# Patient Record
Sex: Female | Born: 1980 | Race: White | Hispanic: No | Marital: Married | Smoking: Never smoker
Health system: Southern US, Community
[De-identification: ages and names within clinical notes are randomized; demographics above are authoritative.]

## PROBLEM LIST (undated history)

## (undated) DIAGNOSIS — F419 Anxiety disorder, unspecified: Secondary | ICD-10-CM

## (undated) DIAGNOSIS — D649 Anemia, unspecified: Secondary | ICD-10-CM

## (undated) HISTORY — DX: Anxiety disorder, unspecified: F41.9

## (undated) HISTORY — DX: Anemia, unspecified: D64.9

## (undated) HISTORY — PX: OTHER SURGICAL HISTORY: SHX169

---
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
## 2002-10-17 ENCOUNTER — Inpatient Hospital Stay (HOSPITAL_COMMUNITY): Admission: AD | Admit: 2002-10-17 | Discharge: 2002-10-17 | Payer: Self-pay | Admitting: *Deleted

## 2004-11-14 ENCOUNTER — Other Ambulatory Visit: Admission: RE | Admit: 2004-11-14 | Discharge: 2004-11-14 | Payer: Self-pay | Admitting: Obstetrics and Gynecology

## 2005-03-29 ENCOUNTER — Ambulatory Visit (HOSPITAL_COMMUNITY): Admission: RE | Admit: 2005-03-29 | Discharge: 2005-03-29 | Payer: Self-pay | Admitting: Obstetrics and Gynecology

## 2005-05-29 ENCOUNTER — Inpatient Hospital Stay (HOSPITAL_COMMUNITY): Admission: RE | Admit: 2005-05-29 | Discharge: 2005-06-01 | Payer: Self-pay | Admitting: Obstetrics and Gynecology

## 2005-05-29 ENCOUNTER — Encounter (INDEPENDENT_AMBULATORY_CARE_PROVIDER_SITE_OTHER): Payer: Self-pay | Admitting: Specialist

## 2006-01-17 ENCOUNTER — Other Ambulatory Visit: Admission: RE | Admit: 2006-01-17 | Discharge: 2006-01-17 | Payer: Self-pay | Admitting: Obstetrics and Gynecology

## 2006-09-29 IMAGING — US US OB COMP +14 WK
1 series · 13 of 28 positions shown · non-contrast
Comparison: none

CLINICAL DATA: History of intrauterine growth retardation.  Assess growth.

[Series 1: us ob comp +14 wk · 13 of 31 slices shown]
[im 2/31]
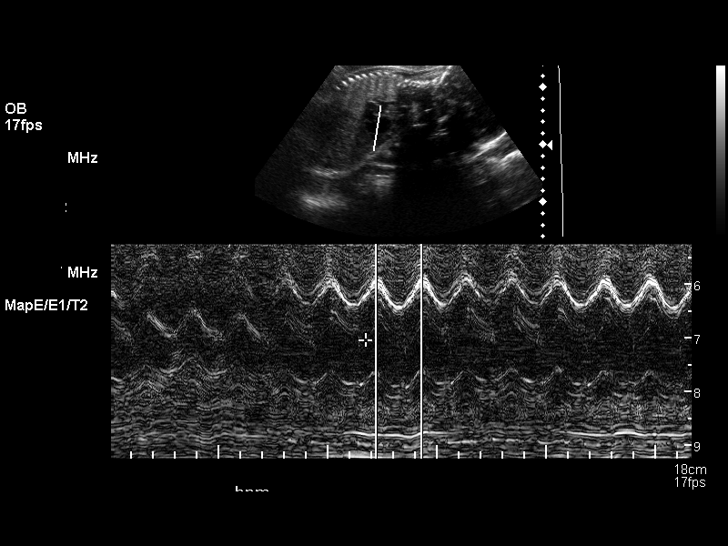
[im 4/31]
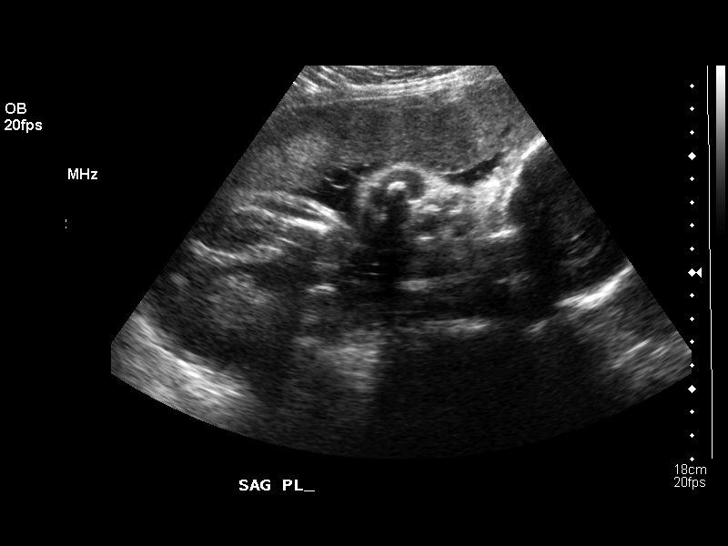
[im 6/31]
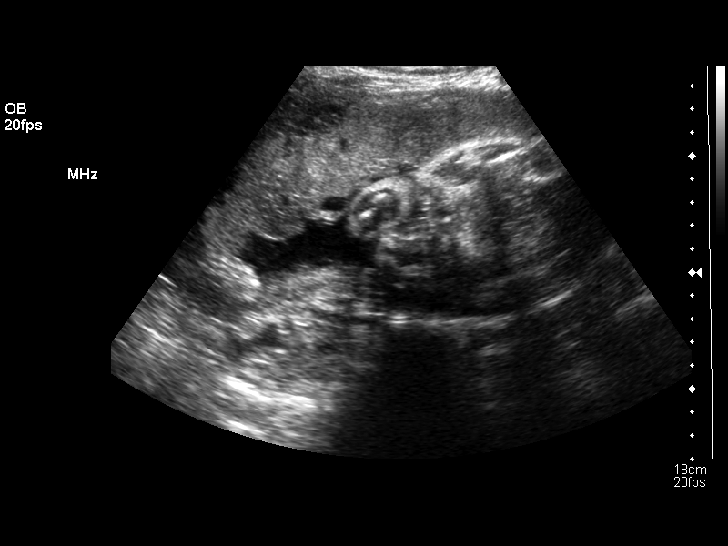
[im 8/31]
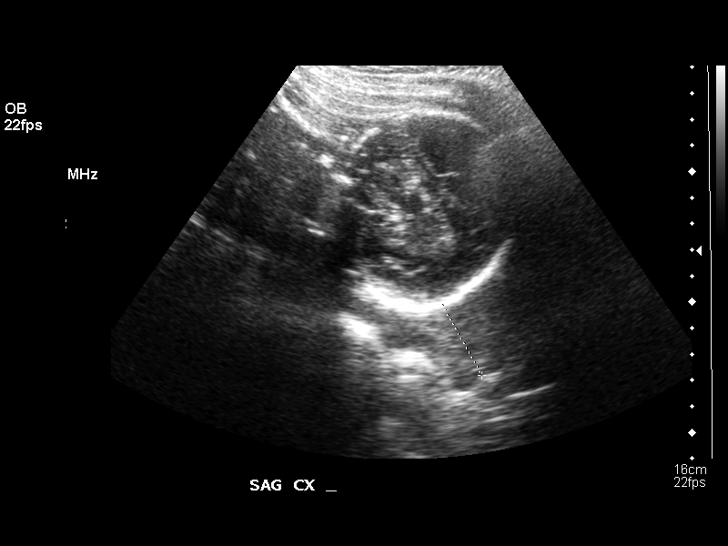
[im 11/31]
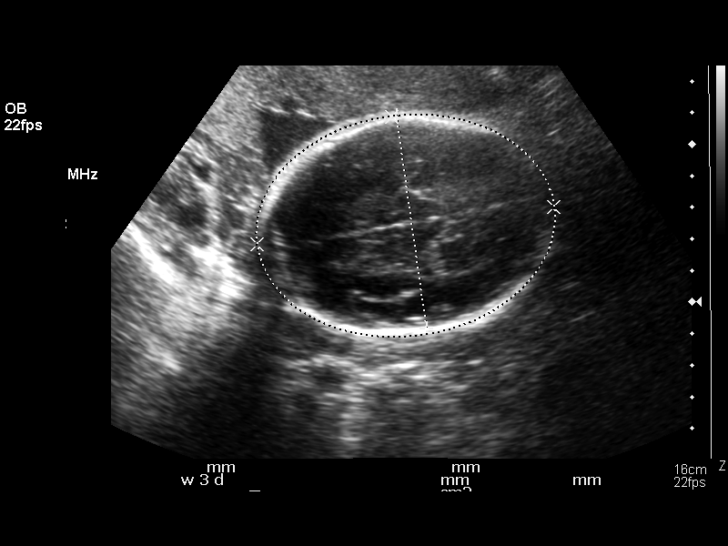
[im 13/31]
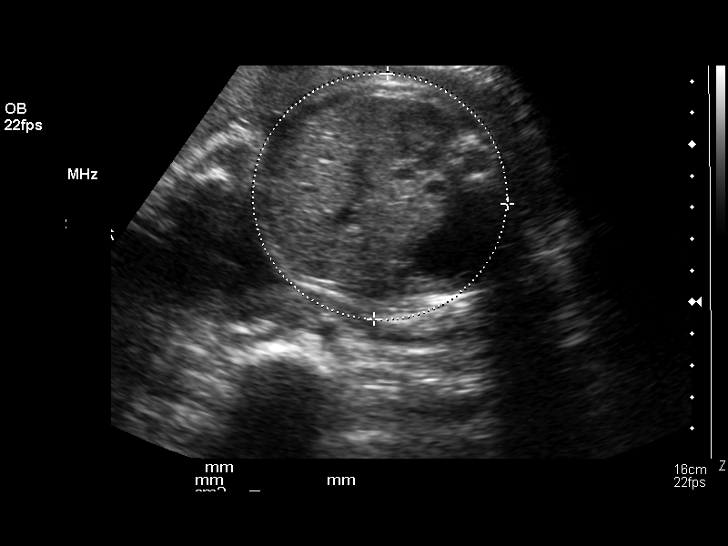
[im 16/31]
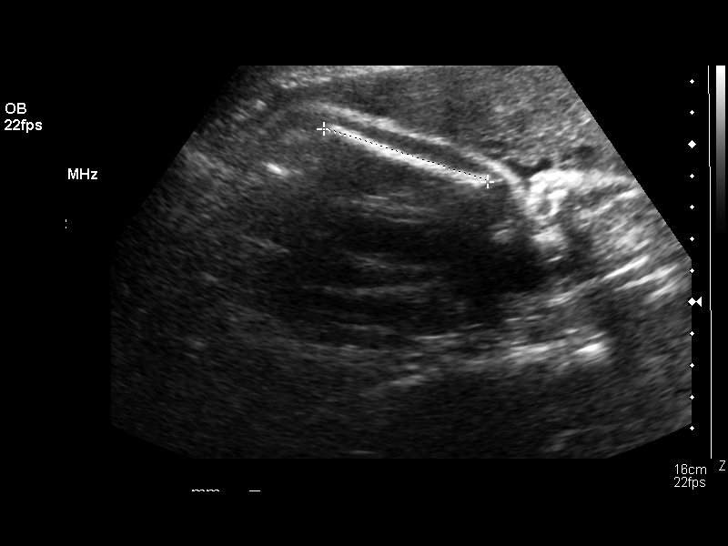
[im 18/31]
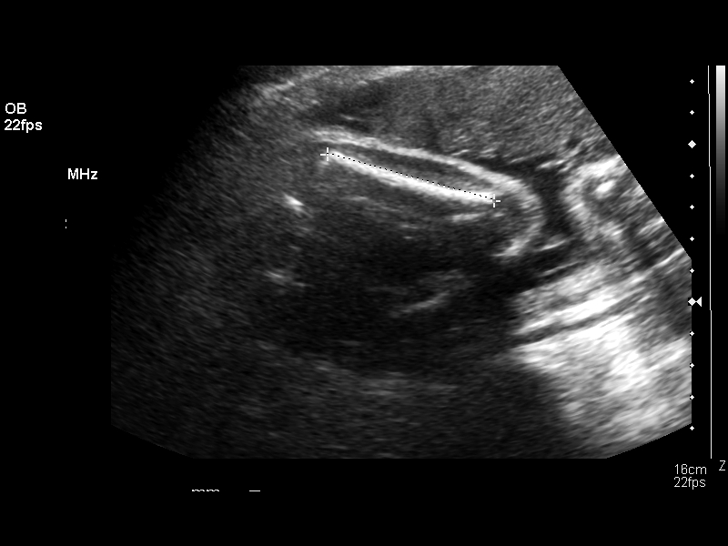
[im 21/31]
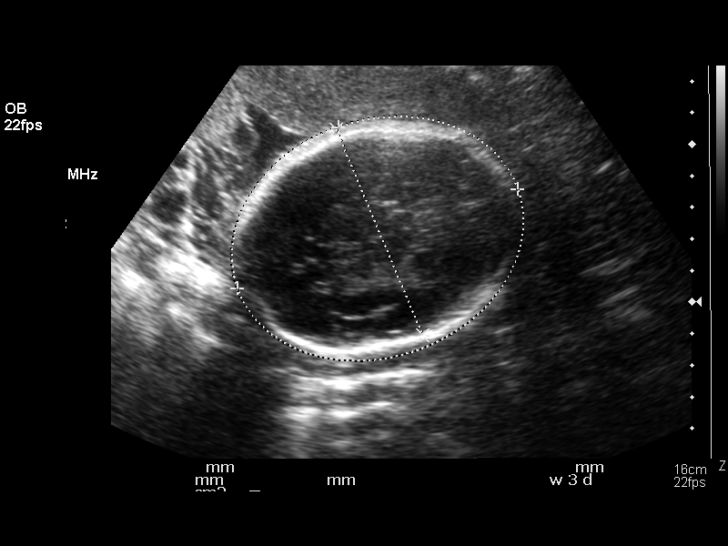
[im 23/31]
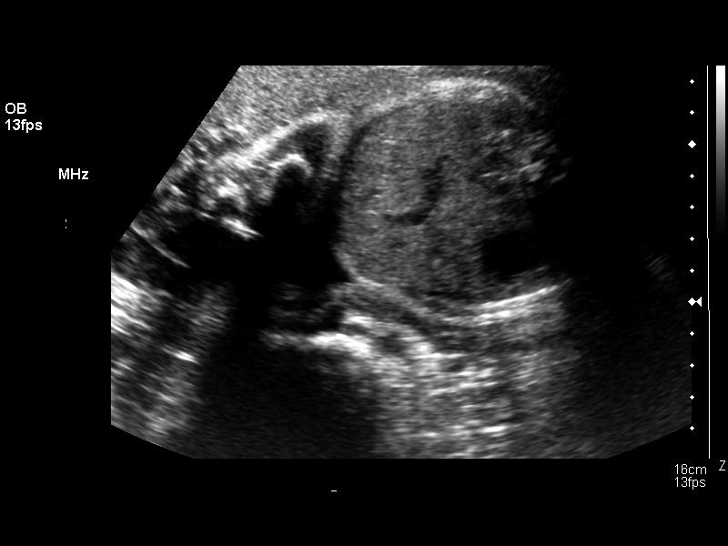
[im 25/31]
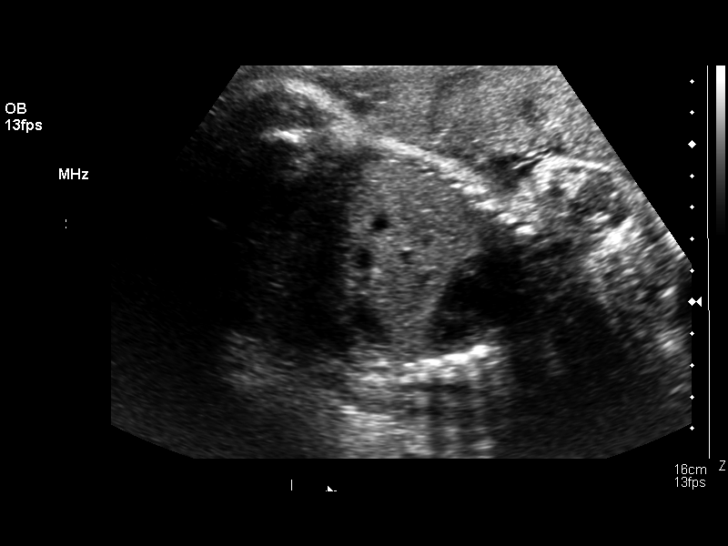
[im 27/31]
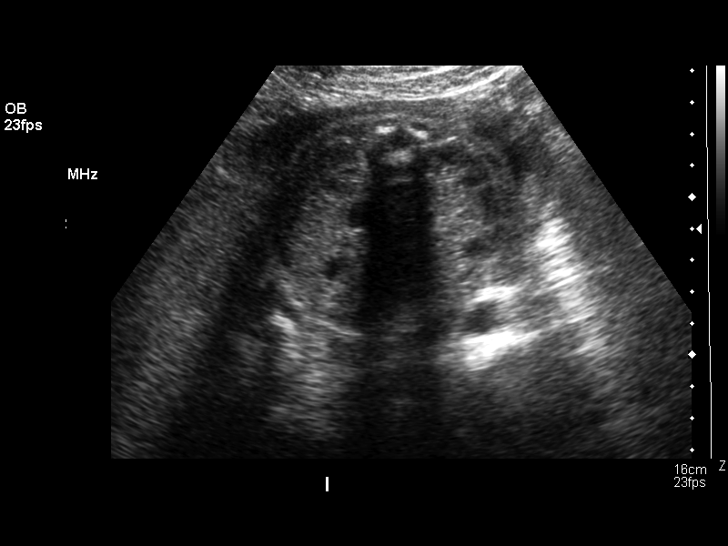
[im 29/31]
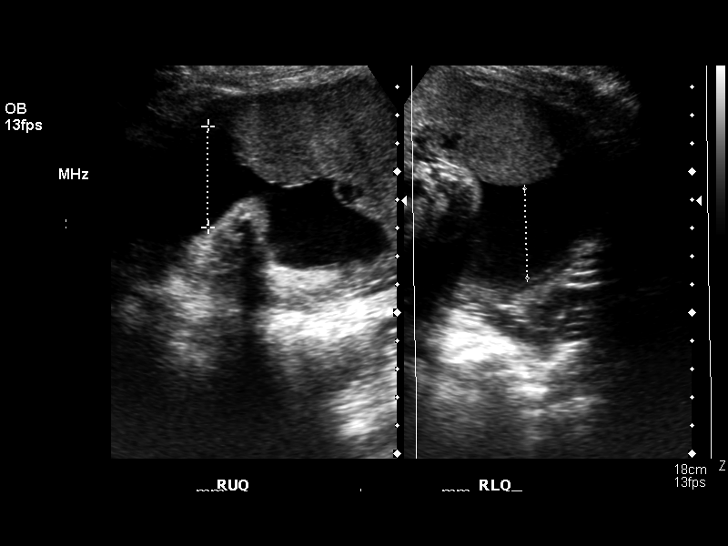

[13 of 28 positions shown; findings below may reference images not displayed]

OBSTETRICAL ULTRASOUND:
Number of Fetuses:  1
Heart Rate:  144
Movement:  Yes
Breathing:    No  
Presentation:  Cephalic
Placental Location:  Anterior
Grade:  I
Previa:  No
Amniotic Fluid (Subjective):  Normal
Amniotic Fluid (Objective):   13.6 cm AFI (5th -95th%ile = 9.0 ? 23.4 cm for 30 wks)

FETAL BIOMETRY
BPD:  6.9 cm   28 w 0 d
HC:  26.4 cm   28 w 5 d
AC:  25.1 cm  29 w 2 d
FL:    5.5 cm   29 w 0 d

MEAN GA:  28 w 5 d
Fetal indices are within normal limits.  
EFW: 0886 g (H) 50th ? 75th%ile (8085 ? 9399 g) For 30 wks

FETAL ANATOMY
Lateral Ventricles:    Not visualized 
Thalami/CSP:      Visualized 
Posterior Fossa:  Not visualized 
Nuchal Region:    N/A
Spine:      Not visualized 
4 Chamber Heart on Left:      Not visualized 
Stomach on Left:      Visualized 
3 Vessel Cord:    Not visualized 
Cord Insertion site:    Not visualized 
Kidneys:  Visualized 
Bladder:  Visualized 
Extremities:      Not visualized 

ADDITIONAL ANATOMY VISUALIZED:  Diaphragm and female genitalia.

Evaluation limited by:  Advanced gestational age 

MATERNAL UTERINE AND ADNEXAL FINDINGS
Cervix: 3.1 cm transabdominally
IMPRESSION: 1.  Single intrauterine pregnancy demonstrating an estimated gestational age by ultrasound of 28 weeks and 5 days.  This is 1 week and 3 days behind assigned gestational age of 30 weeks and 1 day.  The estimated fetal weight is just above the 50th percentile for a 30 week gestation however and suggests appropriate growth.  Continued close follow-up for growth for growth would be recommended given the patient?s history of early IUGR with early development.
2.  Subjectively and quantitatively normal amniotic fluid volume and normal cervical length.
3.  No late developing fetal anatomic abnormalities are identified associated with the stomach, kidneys or bladder.  The lateral ventricles and four chamber heart view could not be seen with clarity due to positioning on today?s exam.

## 2009-03-28 ENCOUNTER — Ambulatory Visit (HOSPITAL_COMMUNITY): Admission: RE | Admit: 2009-03-28 | Discharge: 2009-03-28 | Payer: Self-pay | Admitting: Obstetrics and Gynecology

## 2009-03-28 ENCOUNTER — Encounter (INDEPENDENT_AMBULATORY_CARE_PROVIDER_SITE_OTHER): Payer: Self-pay | Admitting: Obstetrics and Gynecology

## 2010-05-08 ENCOUNTER — Emergency Department (HOSPITAL_COMMUNITY): Admission: EM | Admit: 2010-05-08 | Discharge: 2010-05-08 | Payer: Self-pay | Admitting: Emergency Medicine

## 2010-08-18 ENCOUNTER — Emergency Department (HOSPITAL_COMMUNITY)
Admission: EM | Admit: 2010-08-18 | Discharge: 2010-08-18 | Payer: Self-pay | Source: Home / Self Care | Admitting: Family Medicine

## 2010-11-28 LAB — GC/CHLAMYDIA PROBE AMP, GENITAL
Chlamydia, DNA Probe: NEGATIVE
GC Probe Amp, Genital: NEGATIVE

## 2010-11-28 LAB — POCT URINALYSIS DIPSTICK
Glucose, UA: NEGATIVE mg/dL
Ketones, ur: NEGATIVE mg/dL
Urobilinogen, UA: 1 mg/dL (ref 0.0–1.0)

## 2010-11-28 LAB — WET PREP, GENITAL
Trich, Wet Prep: NONE SEEN
WBC, Wet Prep HPF POC: NONE SEEN

## 2010-12-01 LAB — WET PREP, GENITAL
Trich, Wet Prep: NONE SEEN
Yeast Wet Prep HPF POC: NONE SEEN

## 2010-12-01 LAB — GC/CHLAMYDIA PROBE AMP, GENITAL: Chlamydia, DNA Probe: NEGATIVE

## 2010-12-24 LAB — CBC
Hemoglobin: 8.3 g/dL — ABNORMAL LOW (ref 12.0–15.0)
MCHC: 30.9 g/dL (ref 30.0–36.0)
RDW: 18.3 % — ABNORMAL HIGH (ref 11.5–15.5)
WBC: 4.4 10*3/uL (ref 4.0–10.5)

## 2011-01-30 NOTE — Op Note (Signed)
NAME:  Tara Guzman, Tara Guzman                 ACCOUNT NO.:  [id number]   MEDICAL RECORD NO.:  [id number]          PATIENT TYPE:  AMB   LOCATION:  SDC                           [facility]:  [facility]   PHYSICIAN:  Dois Davenport A. Rivard, M.D. DATE OF BIRTH:  April 24, 1981   DATE OF PROCEDURE:  03/28/2009  DATE OF DISCHARGE:  03/28/2009                               OPERATIVE REPORT   PREOPERATIVE DIAGNOSES:  Metrorrhagia, endometrial polyps, anemia.   POSTOPERATIVE DIAGNOSES:  Metrorrhagia, endometrial polyps, anemia.   ANESTHESIA:  General, Dr. Arby Barrette.   PROCEDURE:  Hysteroscopy, resection of endometrial polyps, dilatation  and curettage.   SURGEON:  Crist Fat. Rivard, MD   ASSISTANT:  No assistant.   ESTIMATED BLOOD LOSS:  Minimal.   PROCEDURE:  After being informed of the planned procedure with possible  complications including bleeding, infection, injury to uterus, informed  consent is obtained.  The patient is taken to OR #3 given general  anesthesia with laryngeal mask ventilation without any complication.  She is placed in lithotomy position, prepped, and draped in a sterile  fashion and her bladder is emptied with an in-and-out red rubber  catheter.  Pelvic exam reveals a retroverted uterus normal in size and  shape, 2 normal adnexa.  A weighted speculum is inserted.  Anterior lip  of the cervix was grasped with a tenaculum forceps and we proceed with a  paracervical block using Novocaine 1% 20 mL in the usual fashion.  The  uterus was sounded at 8.5 cm and the cervix was easily dilated using  Hegar dilator until #29 which allows easy entry of an operative  hysteroscope.  With perfusion of glycine 1.5% at a maximum pressure of  80 mmHg, we are able to visualize the entire uterine cavity.   OBSERVATION:  Both tubal ostia are normal.  The fundus was normal.  Anterior wall endometrium is slightly thicker than lateral walls and  posterior wall endometrium is definitely much, much thicker than the  rest of the uterine cavity.  In the lower uterine segment anteriorly and  on the right side, we see 2 endometrial polyps measuring 0.5 cm each  which are easily resected and sent separately.  Hysteroscope was removed  and the uterine cavity is curetted with sharp curette which removes a  large amount of normal-appearing endometrium.  Hysteroscope was  reinserted to visualize an empty cavity with no active bleeding.  Instruments were then removed.  Bleeding on the cervical anterior lip of  the cervix were tenaculum and Jacobs forceps was placed is controlled  with a figure-of-eight stitch of 2-0 Vicryl.   Instrument and sponge count is complete x2.  Estimated blood loss is  minimal.  Fluid deficit is less than 100 and procedure is well tolerated  by the patient, who is taken to recovery room in the well and stable  condition.   SPECIMEN:  Endometrial polyps and endometrial curettings sent to  pathology.      Crist Fat Rivard, M.D.  Electronically Signed     SAR/MEDQ  D:  03/28/2009  T:  03/29/2009  Job:  [id number] 

## 2011-02-02 NOTE — Discharge Summary (Signed)
NAME:  Tara Guzman, Tara Guzman                 ACCOUNT NO.:  [id number]   MEDICAL RECORD NO.:  [id number]          PATIENT TYPE:  INP   LOCATION:  9145                          [facility]ACILITY:  [facility]   PHYSICIAN:  Crist Fat. Rivard, M.D. DATE OF BIRTH:  [birth year]   DATE OF ADMISSION:  05/29/2005  DATE OF DISCHARGE:  06/01/2005                                 DISCHARGE SUMMARY   ADMISSION DIAGNOSES:  1.  Intrauterine pregnancy at 39 weeks.  2.  Two previous cesarean sections.  3.  Desires repeat cesarean section and sterilization.   DISCHARGE DIAGNOSES:  1.  Intrauterine pregnancy at 39 weeks.  2.  Two previous cesarean sections.  3.  Desires repeat cesarean section and sterilization.  4.  Status post repeat left transverse cesarean section of a viable female      infant named Savannah weighing 6 pounds 7 ounces,  Apgars 8 and 9.  5.  Status post bilateral tubal ligation.   OPERATION/PROCEDURE:  1.  Spinal anesthesia.  2.  Repeat low transverse cesarean section.  3.  Bilateral tubal ligation, elective.   HOSPITAL COURSE:  The patient was admitted for an elective cesarean section  and tubal ligation which was performed under spinal anesthesia by Dr. Estanislado Pandy  with no complications.  Estimated blood loss was 500 mL.  She was taken to  recovery where routine care was given.  She was bottle feeding her baby and  got out of bed on the first day and had no syncope despite a hemoglobin of  8.4  She declined blood transfusions.  Postoperative day #2 she continued to  improve with ambulation and increasing her diet.  Incision remained intact.  On postoperative day #3 she was ready to go home.  Vital signs were stable,  afebrile.  Heart regular rate and rhythm. Chest was clear.  Abdomen soft and  appropriately tender.  Incision was clean, dry and intact. Lochia was  normal.  Extremities were normal.  She was deemed to have received full  benefit of her hospital stay and was discharged home.   DISCHARGE  MEDICATIONS:  1.  Motrin 600 mg p.o. q.6h. p.r.n.  2.  Tylox one to two p.o. q.4h. p.r.n.   LABORATORY DATA:  White blood cell count 11.2, hemoglobin 8.4, platelet  count 157, RPR nonreactive.   DISCHARGE INSTRUCTIONS:  Per [facility] handout.   FOLLOW UP:  In six weeks or p.r.n.      Marie L. Williams, C.N.M.      Crist Fat Rivard, M.D.  Electronically Signed    MLW/MEDQ  D:  06/01/2005  T:  06/01/2005  Job:  [id number]

## 2011-02-02 NOTE — H&P (Signed)
NAME:  Tara Guzman, Tara Guzman                 ACCOUNT NO.:  [id number]   MEDICAL RECORD NO.:  [id number]          PATIENT TYPE:  INP   LOCATION:  NA                            [facility]   PHYSICIAN:  Dois Davenport A. Rivard, M.D. DATE OF BIRTH:  [birth year]   DATE OF ADMISSION:  05/29/2005  DATE OF DISCHARGE:                                HISTORY & PHYSICAL   HISTORY OF PRESENT ILLNESS:  Tara Guzman is a 30 year old, gravida 4, para 1-1-  1-2, who presents today for repeat cesarean section and bilateral tubal  sterilization. Her pregnancy has been followed by the MD service at [facility] and  is remarkable for: Previous C-section, desires repeat, desires  sterilization; history of IUGR, ?LMP and a history of abnormal Pap smear.  This patient began prenatal care at [facility] on November 14, 2004. EDC  determined by ultrasound in the early first trimester and confirmed with  follow-up. Her pregnancy has been essentially uncomplicated. She has been  size equal to dates throughout, she has been normotensive with no  proteinuria. The patient's due date is June 06, 2005, and she presents  at 79 weeks' gestation, term, for a repeat cesarean delivery and bilateral  tubal sterilization.   PRENATAL LABORATORY WORK:  On November 27, 2004, hemoglobin and hematocrit 12.4  and 36.8, platelets 210,000. Blood type and Rh O+, antibody screen negative,  VDRL nonreactive, rubella immune, hepatitis B surface antigen negative, HIV  nonreactive. Pap smear normal. CF testing negative. Quad screen within  normal limits at 28 weeks. One-hour glucose challenge normal. RPR negative  and hemoglobin 11.2 at 36 weeks, culture of the vaginal tract is negative  for group B strep.   ALLERGIES:  The patient has no known drug allergies.   HABITS:  She denies the use of tobacco, alcohol or illicit drugs.   OBSTETRICAL HISTORY:  In 2001 the patient had a first trimester elective AB  with no complications, in 2002 the patient had a cesarean  delivery at 26  weeks, that baby's name was Tara Guzman and was in the NICU for 3 months, in 2004  the patient had a repeat cesarean delivery at 39 weeks with the birth of a 6  pounds 8 ounces female infant at term named Tara Guzman and the current pregnancy.   MEDICAL HISTORY:  Significant for an abnormal Pap smear. Pap smear at the  beginning of this pregnancy was normal. The patient's medical history is  unremarkable.   SURGICAL HISTORY:  C-section x2.   FAMILY HISTORY:  Maternal grandmother MI, deceased. Maternal grandfather  chronic hypertension. Maternal grandfather varicose veins. Maternal  grandmother with a history of diabetes, deceased. Maternal grandfather  strokes. Mother ovarian cancer.   GENETIC HISTORY:  The patient's daughter was born with a heart murmur, no  surgery required. Paternal uncle and paternal aunt with mental retardation.  The patient's father is a twin and the father of the baby's father is a  twin.   SOCIAL HISTORY:  Tara Guzman is a 30 year old married Caucasian female. She  works as a Education administrator and her husband Tara Pilgrim  Guzman works in  Office manager. They are [employer] in their faith.   REVIEW OF SYSTEMS:  As described above. The patient is typical of one with a  uterine pregnancy at term. She presents to [facility] of [place] on  May 29, 2005, for repeat cesarean delivery and bilateral tubal  sterilization.   PHYSICAL EXAMINATION:  VITAL SIGNS:  Stable, afebrile.  HEENT:  Unremarkable.  HEART:  Regular rate and rhythm.  LUNGS:  Clear.  ABDOMEN:  Gravid in its contour. Uterine fundus is noted to extend 40 cm  above the level of the pubic symphysis. Leopold's maneuvers finds the infant  to be in a longitudinal lie, cephalic presentation and the estimated fetal  weight is 7 pounds.  The fetal heart rate has been stable at 150s.  EXTREMITIES:  Show no pathologic edema. There is no calf tenderness. DTRs  are 1+ with no clonus  please.   ASSESSMENT:  Intrauterine pregnancy at 39 weeks, for repeat cesarean  delivery and bilateral tubal ligation.   PLAN:  Admit per Dr. Dois Davenport Rivard, routine preoperative orders in  preparation for surgery.      Tara Guzman, C.N.M.      Crist Fat Rivard, M.D.  Electronically Signed    SDM/MEDQ  D:  05/29/2005  T:  05/29/2005  Job:  [id number]

## 2011-02-02 NOTE — Op Note (Signed)
NAME:  Tara Guzman, Tara Guzman                 ACCOUNT NO.:  [id number]   MEDICAL RECORD NO.:  [id number]          PATIENT TYPE:  INP   LOCATION:  9145                          [facility]   PHYSICIAN:  Crist Fat. Rivard, M.D. DATE OF BIRTH:  07-31-1981   DATE OF PROCEDURE:  05/29/2005  DATE OF DISCHARGE:                                 OPERATIVE REPORT   PREOPERATIVE DIAGNOSES:  1.  Intrauterine pregnancy at 39 weeks.  2.  Two previous cesarean sections.  3.  Desire for repeat cesarean section and sterilization.   POSTOPERATIVE DIAGNOSES:  1.  Intrauterine pregnancy at 39 weeks.  2.  Two previous cesarean sections.  3.  Desire for repeat cesarean section and sterilization.   ANESTHESIA:  Spinal.   PROCEDURE:  Repeat low transverse cesarean section and bilateral tubal  ligation.   SURGEON:  Dr. Estanislado Pandy   ASSISTANT:  Mack Guise, certified nurse midwife.   ESTIMATED BLOOD LOSS:  500 mL.   PROCEDURE:  After being informed of the planned procedure with possible  complications including bleeding; infection; injury to bowels, bladder and  ureters as well as irreversibility of tubal ligation and failure rate of  1:500, informed consent was obtained.  The patient is taken to OR #1, given  spinal anesthesia without complication.  She is placed in the dorsal  decubitus position, pelvis tilted to the left, prepped and draped in a  sterile fashion, and a Foley catheter is inserted in her bladder.   After assessing adequate level of anesthesia, we proceed with local  infiltration of the incision with 20 mL of Marcaine 0.25 and perform a  Pfannenstiel incision overlooking the previous scar which is brought down to  the fascia sharply.  Fascia is incised in a low transverse fashion.  Linea  alba is dissected.  Peritoneum is entered in the midline fashion.  Visceral  peritoneum is entered in a low transverse fashion allowing [place] to safely  retract bladder by developing a bladder flap.   Myometrium which is very thin  is then incised in a low transverse fashion, first with knife, extended  bluntly.  Amniotic fluid is clear.  We assist the birth of a female infant  at 23 in ROA presentation.  Mouth and nose are suctioned.  Baby is  delivered. Cord is clamped with two Kelly clamps and suctioned and the baby  is given to the pediatrician present in the room.  Ancef 1 gram IV is given  to the patient; 20 mL of blood is drawn from the umbilical vein, and the  placenta is allowed to deliver spontaneously.  It is complete, and the cord  has 3 vessels.  Uterine revision is negative.   Myometrium is closed in two layers, first with a running locked suture of 0  Vicryl then with a Lembert suture of 0 Vicryl imbricating the first layer.  Hemostasis is checked and adequate.  At this time, we note adhesion between  the left ovary and the anterior wall of the uterus.  These adhesions are  sectioned with cautery.  The ovary is completely  freed.  Both paracolic  gutters are clean.  Both tubes and ovaries are then assessed and normal, and  the pelvis is profusely irrigated with warm saline.  Hemostasis is rechecked  and deemed adequate.   We then proceed with tubal ligation by isolating each tube using 2 Babcock  forceps until fimbriae is visualized.  The mesosalpinx is entered with  cautery.  Each tube is doubly ligated on the proximal stump and on the  distal stump using 0 chromic.  A section of 1.5 cm is sharply removed, and  both stumps of each tube is then cauterized.  Hemostasis is adequate.  We  rechecked hemostasis on the uterine incision which is adequate.  On the  fascia hemostasis is completed with cautery, and the fascia is closed with 2  running sutures of 1 Vicryl meeting midline.  Incision is then irrigated  with saline, and scar tissue is released on the right angle with cautery.  Skin is closed with subcuticular suture of 3-0 Monocryl and Steri-Strips.   Instruments  and sponge count is complete x 2.  Estimated blood loss is 500  mL.  The little girl named Tara Guzman, was born at 3:45 p.m., received  Apgars of 8 at one minute and 9 at five minutes and weighs 6 pounds 7  ounces.  Instruments and sponge count is complete x 2.  Estimated blood loss  is 500 mL.  The procedure is very well tolerated by the patient who is taken  to recovery room in a well and stable condition.      Crist Fat Rivard, M.D.  Electronically Signed     SAR/MEDQ  D:  05/29/2005  T:  05/29/2005  Job:  [id number]

## 2012-11-19 ENCOUNTER — Encounter (INDEPENDENT_AMBULATORY_CARE_PROVIDER_SITE_OTHER): Payer: 59 | Admitting: Physician Assistant
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
## 2012-11-26 ENCOUNTER — Ambulatory Visit: Payer: 59 | Admitting: Obstetrics and Gynecology

## 2012-11-26 ENCOUNTER — Encounter: Payer: Self-pay | Admitting: Obstetrics and Gynecology

## 2012-11-26 VITALS — BP 118/66 | Ht 60.0 in | Wt 157.0 lb

## 2012-11-26 DIAGNOSIS — Z9851 Tubal ligation status: Secondary | ICD-10-CM

## 2012-11-26 DIAGNOSIS — F529 Unspecified sexual dysfunction not due to a substance or known physiological condition: Secondary | ICD-10-CM

## 2012-11-26 DIAGNOSIS — Z9889 Other specified postprocedural states: Secondary | ICD-10-CM

## 2012-11-26 DIAGNOSIS — Z124 Encounter for screening for malignant neoplasm of cervix: Secondary | ICD-10-CM

## 2012-11-26 DIAGNOSIS — Z01419 Encounter for gynecological examination (general) (routine) without abnormal findings: Secondary | ICD-10-CM

## 2012-11-26 NOTE — Progress Notes (Signed)
The patient reports:low libido/ sexual desire  Contraception:uterine ablation   Last mammogram:n/a Last pap:per pt 2013  States normal  GC/Chlamydia cultures offered: declined HIV/RPR/HbsAg offered:  declined HSV 1 and 2 glycoprotein offered: declined  Menstrual cycle regular and monthly: No:  Had uterine ablation 2012 Menstrual flow normal: No:  Had surgery  Urinary symptoms: none Normal bowel movements: Yes Reports abuse at home: No:   Subjective:    Tara Guzman is a 32 y.o. female, No obstetric history on file., who presents for an annual exam.     History   Social History  . Marital Status: Married    Spouse Name: Gerilyn Pilgrim    Number of Children: 3  . 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 Smoking status: Never Smoker   . Smokeless tobacco: Never Used  . Alcohol Use: 0 - .5 oz/week    0-1 drink(s) per week     Comment: once every few months  . Drug Use: No  . Sexually Active: Yes -- Female partner(s)    Birth Control/ Protection: Surgical   Other Topics Concern  . None   Social History Narrative   Lives with her husband and their 3 daughters.    Menstrual cycle:   LMP: No LMP recorded. Patient has had an ablation.           Cycle: amenorrhea  The following portions of the patient's history were reviewed and updated as appropriate: allergies, current medications, past family history, past medical history, past social history, past surgical history and problem list.  Review of Systems Pertinent items are noted in HPI. Breast:Negative for breast lump,nipple discharge or nipple retraction Gastrointestinal: Negative for abdominal pain, change in bowel habits or rectal bleeding Urinary:negative   Objective:    There were no vitals taken for this visit.    Weight:  Wt Readings from Last 1 Encounters:  11/19/12 156 lb 12.8 oz (71.124 kg)          BMI: There is no weight on  file to calculate BMI.  General Appearance: Alert, appropriate appearance for age. No acute distress HEENT: Grossly normal Neck / Thyroid: Supple, no masses, nodes or enlargement Lungs: clear to auscultation bilaterally Back: No CVA tenderness Breast Exam: No masses or nodes.No dimpling, nipple retraction or discharge. Cardiovascular: Regular rate and rhythm. S1, S2, no murmur Gastrointestinal: Soft, non-tender, no masses or organomegaly Pelvic Exam: Vulva and vagina appear normal. Bimanual exam reveals normal uterus and adnexa. Rectovaginal: not indicated Lymphatic Exam: Non-palpable nodes in neck, clavicular, axillary, or inguinal regions Skin: no rash or abnormalities Neurologic: Normal gait and speech, no tremor  Psychiatric: Alert and oriented, appropriate affect.    Assessment:    Normal gyn exam  Low sexual desire   Plan:    pap smear return annually or prn STD screening: declined Contraception:bilateral tubal ligation  Decreased Libido Discussed with following emphasis:  1. Libido is multi-factorial in women and can be affected by a multitude of events: stress, fatigue, low self-image 2. There is no magic pill to resolve all libido issues 3. Topical can be used to improve sexual response / arousal state / orgasm 4. Sometimes associated with low testosterone:       patient desires levels to be drawn: yes 5. If low progesterone confirmed, may benefit from testosterone supplement 6. "Hot monogamy" suggested    Silverio Lay MD

## 2012-11-27 LAB — TESTOSTERONE, FREE, TOTAL, SHBG
Sex Hormone Binding: 62 nmol/L (ref 18–114)
Testosterone, Free: 8.6 pg/mL — ABNORMAL HIGH (ref 0.6–6.8)
Testosterone-% Free: 1.2 % (ref 0.4–2.4)
Testosterone: 72 ng/dL — ABNORMAL HIGH (ref 10–70)

## 2012-11-27 NOTE — Progress Notes (Signed)
Quick Note:  Please inform that testosterone is high and is not a candidate for supplementation. Consider all other causes reviewed for low sex drive. Reading: Hot Monogamy ______

## 2012-11-28 ENCOUNTER — Telehealth: Payer: Self-pay

## 2012-11-28 LAB — PAP IG W/ RFLX HPV ASCU

## 2012-11-28 NOTE — Telephone Encounter (Signed)
Message copied by Darien Ramus on Fri Nov 28, 2012  8:33 AM ------      Message from: Silverio Lay      Created: Thu Nov 27, 2012  7:43 PM       Please inform that testosterone is high and is not a candidate for supplementation. Consider all other causes reviewed for low sex drive. Reading: Hot Monogamy ------

## 2012-11-28 NOTE — Telephone Encounter (Signed)
Pt aware  Kaileena Obi, CMA  

## 2014-01-14 ENCOUNTER — Encounter (HOSPITAL_COMMUNITY): Payer: Self-pay | Admitting: Emergency Medicine

## 2014-01-14 ENCOUNTER — Emergency Department (INDEPENDENT_AMBULATORY_CARE_PROVIDER_SITE_OTHER)
Admission: EM | Admit: 2014-01-14 | Discharge: 2014-01-14 | Disposition: A | Discharge status: Home / Self Care | Payer: 59 | Source: Home / Self Care | Attending: Emergency Medicine | Admitting: Emergency Medicine

## 2014-01-14 DIAGNOSIS — N3000 Acute cystitis without hematuria: Secondary | ICD-10-CM

## 2014-01-14 LAB — POCT URINALYSIS DIP (DEVICE)
GLUCOSE, UA: 250 mg/dL — AB
NITRITE: POSITIVE — AB
PROTEIN: 100 mg/dL — AB
SPECIFIC GRAVITY, URINE: 1.02 (ref 1.005–1.030)
UROBILINOGEN UA: 4 mg/dL — AB (ref 0.0–1.0)
pH: 5 (ref 5.0–8.0)

## 2014-01-14 MED ORDER — FLUCONAZOLE 150 MG PO TABS: 150.0000 mg | ORAL_TABLET | Freq: Once | ORAL | Status: AC

## 2014-01-14 MED ORDER — CEPHALEXIN 500 MG PO CAPS: 500.0000 mg | ORAL_CAPSULE | Freq: Three times a day (TID) | ORAL | Status: AC

## 2014-01-14 NOTE — Discharge Instructions (Signed)
Urinary Tract Infection  Urinary tract infections (UTIs) can develop anywhere along your urinary tract. Your urinary tract is your body's drainage system for removing wastes and extra water. Your urinary tract includes two kidneys, two ureters, a bladder, and a urethra. Your kidneys are a pair of bean-shaped organs. Each kidney is about the size of your fist. They are located below your ribs, one on each side of your spine.  CAUSES  Infections are caused by microbes, which are microscopic organisms, including fungi, viruses, and bacteria. These organisms are so small that they can only be seen through a microscope. Bacteria are the microbes that most commonly cause UTIs.  SYMPTOMS   Symptoms of UTIs may vary by age and gender of the patient and by the location of the infection. Symptoms in young women typically include a frequent and intense urge to urinate and a painful, burning feeling in the bladder or urethra during urination. Older women and men are more likely to be tired, shaky, and weak and have muscle aches and abdominal pain. A fever may mean the infection is in your kidneys. Other symptoms of a kidney infection include pain in your back or sides below the ribs, nausea, and vomiting.  DIAGNOSIS  To diagnose a UTI, your caregiver will ask you about your symptoms. Your caregiver also will ask to provide a urine sample. The urine sample will be tested for bacteria and white blood cells. White blood cells are made by your body to help fight infection.  TREATMENT   Typically, UTIs can be treated with medication. Because most UTIs are caused by a bacterial infection, they usually can be treated with the use of antibiotics. The choice of antibiotic and length of treatment depend on your symptoms and the type of bacteria causing your infection.  HOME CARE INSTRUCTIONS   If you were prescribed antibiotics, take them exactly as your caregiver instructs you. Finish the medication even if you feel better after you  have only taken some of the medication.   Drink enough water and fluids to keep your urine clear or pale yellow.   Avoid caffeine, tea, and carbonated beverages. They tend to irritate your bladder.   Empty your bladder often. Avoid holding urine for long periods of time.   Empty your bladder before and after sexual intercourse.   After a bowel movement, women should cleanse from front to back. Use each tissue only once.  SEEK MEDICAL CARE IF:    You have back pain.   You develop a fever.   Your symptoms do not begin to resolve within 3 days.  SEEK IMMEDIATE MEDICAL CARE IF:    You have severe back pain or lower abdominal pain.   You develop chills.   You have nausea or vomiting.   You have continued burning or discomfort with urination.  MAKE SURE YOU:    Understand these instructions.   Will watch your condition.   Will get help right away if you are not doing well or get worse.  Document Released: 06/13/2005 Document Revised: 03/04/2012 Document Reviewed: 10/12/2011  [employer] Patient Information 2014 [employer]

## 2014-01-14 NOTE — ED Notes (Signed)
C/o uti States her urine has an odor, urinating frequently and has the urgency Azo was used as tx

## 2014-01-14 NOTE — ED Provider Notes (Signed)
  Chief Complaint   Chief Complaint  Patient presents with  . Urinary Tract Infection    History of Present Illness   Tara Guzman is a 33 year old female who has had a four-day history of urinary odor, dysuria, frequency, and urgency. She denies any hematuria. She's had no fever, chills, nausea, vomiting, lower back, or lower abdominal pain. She denies any GYN symptoms. No obvious precipitating factors. Last urinary tract infection was over a year ago.  Review of Systems   Other than as noted above, the patient denies any of the following symptoms: General:  No fevers or chills. GI:  No abdominal pain, back pain, nausea, or vomiting. GU:  No hematuria or incontinence. GYN:  No discharge, itching, vulvar pain or lesions, pelvic pain, or abnormal vaginal bleeding.  PMFSH   Past medical history, family history, social history, meds, and allergies were reviewed.    Physical Examination     Vital signs:  BP 141/89  Pulse 78  Temp(Src) 98.5 F (36.9 C) (Oral)  Resp 16  SpO2 99% Gen:  Alert, oriented, in no distress. Lungs:  Clear to auscultation, no wheezes, rales or rhonchi. Heart:  Regular rhythm, no gallop or murmer. Abdomen:  Flat and soft. There was slight suprapubic pain to palpation.  No guarding, or rebound.  No hepato-splenomegaly or mass.  Bowel sounds were normally active.  No hernia. Back:  No CVA tenderness.  Skin:  Clear, warm and dry.  Labs   Results for orders placed during the hospital encounter of 01/14/14  POCT URINALYSIS DIP (DEVICE)      Result Value Ref Range   Glucose, UA 250 (*) NEGATIVE mg/dL   Bilirubin Urine SMALL (*) NEGATIVE   Ketones, ur TRACE (*) NEGATIVE mg/dL   Specific Gravity, Urine 1.020  1.005 - 1.030   Hgb urine dipstick TRACE (*) NEGATIVE   pH 5.0  5.0 - 8.0   Protein, ur 100 (*) NEGATIVE mg/dL   Urobilinogen, UA 4.0 (*) 0.0 - 1.0 mg/dL   Nitrite POSITIVE (*) NEGATIVE   Leukocytes, UA LARGE (*) NEGATIVE     A urine culture was  obtained.  Results are pending at this time and we will call about any positive results.  Assessment   The encounter diagnosis was Acute cystitis.   No evidence of pyelonephritis.   Plan   1.  Meds:  The following meds were prescribed:   New Prescriptions   CEPHALEXIN (KEFLEX) 500 MG CAPSULE    Take 1 capsule (500 mg total) by mouth 3 (three) times daily.   FLUCONAZOLE (DIFLUCAN) 150 MG TABLET    Take 1 tablet (150 mg total) by mouth once.    2.  Patient Education/Counseling:  The patient was given appropriate handouts, self care instructions, and instructed in symptomatic relief. The patient was told to avoid intercourse for 10 days, get extra fluids, and return for a follow up with her primary care doctor at the completion of treatment for a repeat UA and culture.    3.  Follow up:  The patient was told to follow up here if no better in 3 to 4 days, or sooner if becoming worse in any way, and given some red flag symptoms such as fever, persistent vomiting, or severe flank or abdominal pain which would prompt immediate return.     Reuben Likesavid C Orian Figueira, MD 01/14/14 0900

## 2014-01-16 ENCOUNTER — Telehealth (HOSPITAL_COMMUNITY): Payer: Self-pay | Admitting: Emergency Medicine

## 2014-01-16 LAB — URINE CULTURE: Colony Count: >=100000

## 2014-01-16 MED ORDER — CIPROFLOXACIN HCL 500 MG PO TABS: 500.0000 mg | ORAL_TABLET | Freq: Two times a day (BID) | ORAL | Status: AC

## 2014-01-16 NOTE — ED Notes (Signed)
Her urine culture grew out Enterobacter which was resistant to cephalexin with which she was treated. Will call patient, and informed her of this result. She should stop the cephalexin and will switch to ciprofloxacin 500 mg, #20, 1 twice a day for 10 days. This will be called into her pharmacy, [facility] [address].  Reuben Likesavid C Abrham Maslowski, MD 01/16/14 [phone]

## 2014-01-19 ENCOUNTER — Telehealth (HOSPITAL_COMMUNITY): Payer: Self-pay | Admitting: *Deleted

## 2014-01-19 NOTE — ED Notes (Signed)
I called pt. Pt. verified x 2 and given results.  Pt. told that the bacteria is resistant to the Keflex and Dr. Lorenz CoasterKeller wants her to stop the Keflex and take all of the Cipro. I told which pharmacy to pick up Rx. Pt. Voiced understanding. 01/19/2014

## 2014-07-19 ENCOUNTER — Encounter (HOSPITAL_COMMUNITY): Payer: Self-pay | Admitting: Emergency Medicine

## 2015-06-07 ENCOUNTER — Emergency Department (HOSPITAL_COMMUNITY)
Admission: EM | Admit: 2015-06-07 | Discharge: 2015-06-07 | Disposition: A | Discharge status: Home / Self Care | Payer: Self-pay | Source: Home / Self Care

## 2019-10-26 ENCOUNTER — Ambulatory Visit: Payer: Self-pay

## 2019-11-12 ENCOUNTER — Ambulatory Visit: Payer: Self-pay

## 2021-11-01 DIAGNOSIS — Z1329 Encounter for screening for other suspected endocrine disorder: Secondary | ICD-10-CM | POA: Diagnosis not present

## 2021-11-01 DIAGNOSIS — Z8249 Family history of ischemic heart disease and other diseases of the circulatory system: Secondary | ICD-10-CM | POA: Diagnosis not present

## 2021-11-01 DIAGNOSIS — J302 Other seasonal allergic rhinitis: Secondary | ICD-10-CM | POA: Diagnosis not present

## 2021-11-01 DIAGNOSIS — Z0000 Encounter for general adult medical examination without abnormal findings: Secondary | ICD-10-CM | POA: Diagnosis not present

## 2021-11-01 DIAGNOSIS — K219 Gastro-esophageal reflux disease without esophagitis: Secondary | ICD-10-CM | POA: Diagnosis not present

## 2021-11-01 DIAGNOSIS — Z833 Family history of diabetes mellitus: Secondary | ICD-10-CM | POA: Diagnosis not present

## 2021-11-01 DIAGNOSIS — Z79899 Other long term (current) drug therapy: Secondary | ICD-10-CM | POA: Diagnosis not present

## 2021-11-08 DIAGNOSIS — K219 Gastro-esophageal reflux disease without esophagitis: Secondary | ICD-10-CM | POA: Diagnosis not present

## 2021-11-08 DIAGNOSIS — J302 Other seasonal allergic rhinitis: Secondary | ICD-10-CM | POA: Diagnosis not present
